# Patient Record
Sex: Male | Born: 1990 | Race: Black or African American | Hispanic: No | Marital: Single | Smoking: Light tobacco smoker
Health system: Southern US, Community
[De-identification: ages and names within clinical notes are randomized; demographics above are authoritative.]

## PROBLEM LIST (undated history)

## (undated) DIAGNOSIS — T7840XA Allergy, unspecified, initial encounter: Secondary | ICD-10-CM

## (undated) HISTORY — DX: Allergy, unspecified, initial encounter: T78.40XA

---
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
## 2013-07-15 ENCOUNTER — Ambulatory Visit (INDEPENDENT_AMBULATORY_CARE_PROVIDER_SITE_OTHER): Payer: Federal, State, Local not specified - PPO | Admitting: Emergency Medicine

## 2013-07-15 VITALS — BP 124/72 | HR 64 | Temp 97.9°F | Resp 18 | Ht 71.5 in | Wt 145.8 lb

## 2013-07-15 DIAGNOSIS — Z2089 Contact with and (suspected) exposure to other communicable diseases: Secondary | ICD-10-CM

## 2013-07-15 DIAGNOSIS — Z202 Contact with and (suspected) exposure to infections with a predominantly sexual mode of transmission: Secondary | ICD-10-CM

## 2013-07-15 DIAGNOSIS — R809 Proteinuria, unspecified: Secondary | ICD-10-CM

## 2013-07-15 LAB — POCT URINALYSIS DIPSTICK
Bilirubin, UA: NEGATIVE
Glucose, UA: NEGATIVE
Spec Grav, UA: >=1.03

## 2013-07-15 NOTE — Addendum Note (Signed)
Addended by: Max Fickle on: 07/15/2013 08:14 PM   Modules accepted: Orders

## 2013-07-15 NOTE — Patient Instructions (Addendum)
Return to clinic in about 4-6 weeks to check on your your for protein.   Proteinuria Proteinuria is a condition in which urine contains more protein than is normal. Proteinuria is either a sign that your body is producing too much protein or a sign that there is a problem with the kidneys. Healthy kidneys prevent most substances that the body needs, including proteins, from leaving the bloodstream and ending up in urine. CAUSES  Proteinuria may be caused by a temporary event or condition such as stress, exercise, or fever, and go away on its own. Proteinuria may also be a symptom of a more serious condition or disease. Causes of proteinuria include:  A kidney disease caused by:  Diabetes.  High blood pressure (hypertension).   A disease that affects the immune system, such as lupus.  A genetic disease, such as Alport's syndrome.  Medicines that damage the kidneys, such as long-term nonsteroidal anti-inflammatory drugs (NSAIDs).  Poisoning or exposure to toxic substances.  A reoccurring kidney or urinary infection.  Excess protein production in the body caused by:  Multiple myeloma.  Amyloidosis. SYMPTOMS You may have proteinuria without having noticeable symptoms. If there is a large amount of protein in your urine, your urine may look foamy. You may also notice swelling (edema) in your hands, feet, abdomen, or face. DIAGNOSIS To determine whether you have proteinuria, you will need to provide a urine sample. Your urine will then be tested for too much protein and the main blood protein albumin. If your test shows that you have proteinuria, you may need to take additional tests to determine its cause, how much protein is in your urine, and what type of protein is being lost. Tests may include:  Blood tests.  Urine tests.  A blood pressure measurement.  Imaging tests. TREATMENT  Treatment will depend on the cause of your proteinuria. Your caregiver will discuss treatment  options with you after you have been diagnosed. If your proteinuria is mild or temporary, no treatment may be necessary. HOME CARE INSTRUCTIONS Ask your caregiver if monitoring the level of protein in your urine at home using simple testing strips is appropriate for you. Early detection of proteinuria can lead to early and often successful treatment of the condition causing it. Document Released: 11/27/2005 Document Revised: 07/01/2012 Document Reviewed: 03/06/2012 [facility] Patient Information 2014 [place], [place].

## 2013-07-15 NOTE — Addendum Note (Signed)
Addended by: Max Fickle on: 07/15/2013 07:52 PM   Modules accepted: Orders

## 2013-07-15 NOTE — Progress Notes (Signed)
Results for orders placed in visit on 07/15/13  POCT URINALYSIS DIPSTICK      Result Value Range   Color, UA yellow     Clarity, UA clear     Glucose, UA neg     Bilirubin, UA neg     Ketones, UA trace     Spec Grav, UA >=1.030     Blood, UA trace     pH, UA 6.0     Protein, UA 2+     Urobilinogen, UA 1.0     Nitrite, UA neg     Leukocytes, UA Negative

## 2013-07-15 NOTE — Progress Notes (Signed)
[facility] [address], [place] [address] [phone]  Date:  07/15/2013   Name:  Frank Oconnell   DOB:  03-18-1991   MRN:  [id number]  PCP:  No primary provider on file.    Chief Complaint: Annual Exam and STD testing   History of Present Illness:  Frank Oconnell is a 22 y.o. very pleasant male patient who presents with the following:  Had intercourse protected by a condom with a new partner.  Later he developed lesions on the base of his penis.  Performed oral sex and he had a "cold sore" on his upper lip following.  No history of STD in past.  No discharge or eruption.  There are no active problems to display for this patient.   Past Medical History  Diagnosis Date  . Allergy     History reviewed. No pertinent past surgical history.  History  Substance Use Topics  . Smoking status: Never Smoker   . Smokeless tobacco: Not on file  . Alcohol Use: No    Family History  Problem Relation Age of Onset  . Hypertension Mother   . Kidney disease Maternal Grandmother     No Known Allergies  Medication list has been reviewed and updated.  No current outpatient prescriptions on file prior to visit.   No current facility-administered medications on file prior to visit.    Review of Systems:  As per HPI, otherwise negative.    Physical Examination: Filed Vitals:   07/15/13 1900  BP: 124/72  Pulse: 64  Temp: 97.9 F (36.6 C)  Resp: 18   Filed Vitals:   07/15/13 1900  Height: 5' 11.5" (1.816 m)  Weight: 145 lb 12.8 oz (66.134 kg)   Body mass index is 20.05 kg/(m^2). Ideal Body Weight: Weight in (lb) to have BMI = 25: 181.4   GEN: WDWN, NAD, Non-toxic, Alert & Oriented x 3 HEENT: Atraumatic, Normocephalic. Herpetic appearing lesion on upper lip Ears and Nose: No external deformity. EXTR: No clubbing/cyanosis/edema NEURO: Normal gait.  PSYCH: Normally interactive. Conversant. Not depressed or anxious appearing.  Calm  demeanor.  GENITALIA:  Normal male circumcised.  Testes, cord, epididymis and phalus normal  Assessment and Plan: STD exposure Labs   Signed,  Phillips Odor, MD

## 2013-07-16 ENCOUNTER — Emergency Department (HOSPITAL_COMMUNITY)
Admission: EM | Admit: 2013-07-16 | Discharge: 2013-07-17 | Disposition: A | Discharge status: Home / Self Care | Payer: Federal, State, Local not specified - PPO | Attending: Emergency Medicine | Admitting: Emergency Medicine

## 2013-07-16 ENCOUNTER — Encounter (HOSPITAL_COMMUNITY): Payer: Self-pay | Admitting: Emergency Medicine

## 2013-07-16 ENCOUNTER — Emergency Department (HOSPITAL_COMMUNITY): Payer: Federal, State, Local not specified - PPO

## 2013-07-16 DIAGNOSIS — I861 Scrotal varices: Secondary | ICD-10-CM | POA: Insufficient documentation

## 2013-07-16 DIAGNOSIS — N509 Disorder of male genital organs, unspecified: Secondary | ICD-10-CM | POA: Insufficient documentation

## 2013-07-16 DIAGNOSIS — N50812 Left testicular pain: Secondary | ICD-10-CM

## 2013-07-16 LAB — COMPREHENSIVE METABOLIC PANEL
ALT: 14 U/L (ref 0–53)
Albumin: 4.6 g/dL (ref 3.5–5.2)
Alkaline Phosphatase: 49 U/L (ref 39–117)
BUN: 9 mg/dL (ref 6–23)
CO2: 29 mEq/L (ref 19–32)
Chloride: 100 mEq/L (ref 96–112)
Glucose, Bld: 167 mg/dL — ABNORMAL HIGH (ref 70–99)
Potassium: 4.1 mEq/L (ref 3.5–5.3)
Sodium: 135 mEq/L (ref 135–145)
Total Protein: 7.4 g/dL (ref 6.0–8.3)

## 2013-07-16 LAB — URINALYSIS, ROUTINE W REFLEX MICROSCOPIC
Bilirubin Urine: NEGATIVE
Hgb urine dipstick: NEGATIVE
Leukocytes, UA: NEGATIVE
Protein, ur: NEGATIVE mg/dL
Specific Gravity, Urine: 1.016 (ref 1.005–1.030)
Urobilinogen, UA: 1 mg/dL (ref 0.0–1.0)
pH: 6 (ref 5.0–8.0)

## 2013-07-16 NOTE — ED Notes (Addendum)
Pt c/o L testicle pain x 2 days, denies injury. Pt states recently drove from [place], noticed pain after trip, pt also states he is having difficulty getting erection.

## 2013-07-16 NOTE — ED Provider Notes (Signed)
CSN: [id number]     Arrival date & time 07/16/13  2151 History   First MD Initiated Contact with Patient 07/16/13 2304     Chief Complaint  Patient presents with  . Testicle Pain   (Consider location/radiation/quality/duration/timing/severity/associated sxs/prior Treatment) HPI 22 year old male presents to emergency room with complaint of left testicle pain on and off for the last 2 days.  He's been taking ibuprofen without improvement.  Patient noted pain while driving down from [place].  He has not noticed any swelling to the testicle, but notes that the underside of his penis looks a little different.  Pain will last for an hour or 2, and then resolve.  No urinary symptoms.  Past Medical History  Diagnosis Date  . Allergy    History reviewed. No pertinent past surgical history. Family History  Problem Relation Age of Onset  . Hypertension Mother   . Kidney disease Maternal Grandmother    History  Substance Use Topics  . Smoking status: Never Smoker   . Smokeless tobacco: Not on file  . Alcohol Use: Yes     Comment: occ    Review of Systems  All other systems reviewed and are negative.    Allergies  Review of patient's allergies indicates no known allergies.  Home Medications   Current Outpatient Rx  Name  Route  Sig  Dispense  Refill  . ibuprofen (ADVIL,MOTRIN) 200 MG tablet   Oral   Take 400 mg by mouth every 8 (eight) hours as needed for pain.          BP 143/73  Pulse 92  Temp(Src) 99.1 F (37.3 C) (Oral)  Resp 18  Ht 5\' 11"  (1.803 m)  Wt 145 lb (65.772 kg)  BMI 20.23 kg/m2  SpO2 100% Physical Exam  Nursing note and vitals reviewed. Constitutional: He is oriented to person, place, and time. He appears well-developed and well-nourished.  HENT:  Head: Normocephalic and atraumatic.  Eyes: Conjunctivae and EOM are normal. Pupils are equal, round, and reactive to light.  Neck: Normal range of motion. Neck supple. No JVD present. No tracheal deviation  present. No thyromegaly present.  Cardiovascular: Normal rate, regular rhythm, normal heart sounds and intact distal pulses.  Exam reveals no gallop and no friction rub.   No murmur heard. Pulmonary/Chest: Effort normal and breath sounds normal. No stridor. No respiratory distress. He has no wheezes. He has no rales. He exhibits no tenderness.  Abdominal: Soft. Bowel sounds are normal. He exhibits no distension and no mass. There is no tenderness. There is no rebound and no guarding.  Genitourinary: Penis normal. No penile tenderness.  Mild tenderness to left testicle.  No swelling, abnormal lie, masses noted  Musculoskeletal: Normal range of motion. He exhibits no edema and no tenderness.  Lymphadenopathy:    He has no cervical adenopathy.  Neurological: He is alert and oriented to person, place, and time. He exhibits normal muscle tone. Coordination normal.  Skin: Skin is warm and dry. No rash noted. No erythema. No pallor.  Psychiatric: He has a normal mood and affect. His behavior is normal. Judgment and thought content normal.    ED Course  Procedures (including critical care time) Labs Review Labs Reviewed  POCT I-STAT, CHEM 8 - Abnormal; Notable for the following:    Creatinine, Ser 1.50 (*)    Calcium, Ion 1.27 (*)    All other components within normal limits  URINALYSIS, ROUTINE W REFLEX MICROSCOPIC   Imaging Review US Scrotum  07/17/2013   *RADIOLOGY REPORT*  Clinical Data:  22 year old male with left testicular pain.  SCROTAL ULTRASOUND DOPPLER ULTRASOUND OF THE TESTICLES  Technique: Complete ultrasound examination of the testicles, epididymis, and other scrotal structures was performed.  Color and spectral Doppler ultrasound were also utilized to evaluate blood flow to the testicles.  Comparison:  None  Findings:  Right testis:  The right testicle is normal in echogenicity and size.  Normal color Doppler flow and arterial/venous waveforms are noted.  Multiple  microcalcifications within the testicle noted. There is no evidence of focal mass.  Left testis:  The left testicle is normal in echogenicity and size. Normal color Doppler flow and arterial/venous waveforms are noted. Multiple microcalcifications within the testicle noted.  There is no evidence of focal mass.  Right epididymis:  Normal in size and appearance.  Left epididymis:  Normal in size and appearance.  Hydrocele:  Absent  Varicocele:  There is a right varicocele with extremely slow flow. There is no evidence of left varicocele.  Pulsed Doppler interrogation of both testes demonstrates low resistance flow bilaterally.  IMPRESSION: No evidence of testicular torsion.  Testicular microlithiasis.  Current literature suggests that testicular microlithiasis is not a significant independent risk factor for development of testicular carcinoma, and that followup imaging is not warranted in the absence of other risk factors. Monthly testicular self-examination and annual physical exams are considered appropriate surveillance.  If patient has other risk factors for testicular carcinoma, then referral to Urology should be considered.  (Reference:  DeCastro, et al.:  A 5-Year Followup Study of Asymptomatic Men with Testicular Microlithiasis.  J Urol 2008; 179:1420-1423.)  Right varicocele with extremely slow flow. Given the very uncommon incidence of isolated right varicocele, further imaging of the abdomen/pelvis with CT/MR may be warranted to exclude a mass lesion or venous anomaly.   Original Report Authenticated By: Harmon Pier, M.D.   Ct Abdomen Pelvis W Contrast  07/17/2013   CLINICAL DATA:  Left-sided testicular pain for 2 days. Right-sided varicocele. Rule out obstructing mass.  EXAM: CT ABDOMEN AND PELVIS WITH CONTRAST  TECHNIQUE: Multidetector CT imaging of the abdomen and pelvis was performed using the standard protocol following bolus administration of intravenous contrast.  CONTRAST:  50mL OMNIPAQUE IOHEXOL  300 MG/ML SOLN, OMNIPAQUE IOHEXOL 300 MG/ML SOLN  COMPARISON:  Ultrasound 1 day prior  FINDINGS: Lung bases: Clear lung bases. Normal heart size without pericardial or pleural effusion.  Abdomen/Pelvis: Normal liver, spleen, stomach, pancreas, gallbladder, biliary tract, adrenal glands, kidneys. No evidence of right renal or perirenal mass. No retroperitoneal or retrocrural adenopathy.  Normal large and small bowel, without ascites.  No pelvic adenopathy. Normal urinary bladder and prostate. No significant free fluid.  Bones/Musculoskeletal: No acute osseous abnormality.  IMPRESSION: Normal abdominal/pelvic CT. No evidence of right-sided renal mass or other explanation for right-sided varicocele.   Electronically Signed   By: Jeronimo Greaves   On: 07/17/2013 04:19   [place] Art/ven Flow Abd Pelv Doppler  07/17/2013   *RADIOLOGY REPORT*  Clinical Data:  22 year old male with left testicular pain.  SCROTAL ULTRASOUND DOPPLER ULTRASOUND OF THE TESTICLES  Technique: Complete ultrasound examination of the testicles, epididymis, and other scrotal structures was performed.  Color and spectral Doppler ultrasound were also utilized to evaluate blood flow to the testicles.  Comparison:  None  Findings:  Right testis:  The right testicle is normal in echogenicity and size.  Normal color Doppler flow and arterial/venous waveforms are noted.  Multiple microcalcifications within the testicle  noted. There is no evidence of focal mass.  Left testis:  The left testicle is normal in echogenicity and size. Normal color Doppler flow and arterial/venous waveforms are noted. Multiple microcalcifications within the testicle noted.  There is no evidence of focal mass.  Right epididymis:  Normal in size and appearance.  Left epididymis:  Normal in size and appearance.  Hydrocele:  Absent  Varicocele:  There is a right varicocele with extremely slow flow. There is no evidence of left varicocele.  Pulsed Doppler interrogation of both testes  demonstrates low resistance flow bilaterally.  IMPRESSION: No evidence of testicular torsion.  Testicular microlithiasis.  Current literature suggests that testicular microlithiasis is not a significant independent risk factor for development of testicular carcinoma, and that followup imaging is not warranted in the absence of other risk factors. Monthly testicular self-examination and annual physical exams are considered appropriate surveillance.  If patient has other risk factors for testicular carcinoma, then referral to Urology should be considered.  (Reference:  DeCastro, et al.:  A 5-Year Followup Study of Asymptomatic Men with Testicular Microlithiasis.  J Urol 2008; 179:1420-1423.)  Right varicocele with extremely slow flow. Given the very uncommon incidence of isolated right varicocele, further imaging of the abdomen/pelvis with CT/MR may be warranted to exclude a mass lesion or venous anomaly.   Original Report Authenticated By: Harmon Pier, M.D.    MDM   1. Pain in left testicle   2. Right varicocele    22 year old male with left testicular pain.  Will check ultrasound, UA.   U/s with concern for right sided varicocele, recommend CT scan.  CT scan normal.  Will f/u with urology prn.   Olivia Mackie, MD 07/17/13 [phone]

## 2013-07-17 ENCOUNTER — Emergency Department (HOSPITAL_COMMUNITY): Payer: Federal, State, Local not specified - PPO

## 2013-07-17 LAB — POCT I-STAT, CHEM 8
Chloride: 101 mEq/L (ref 96–112)
Creatinine, Ser: 1.5 mg/dL — ABNORMAL HIGH (ref 0.50–1.35)
Glucose, Bld: 90 mg/dL (ref 70–99)
HCT: 49 % (ref 39.0–52.0)
Hemoglobin: 16.7 g/dL (ref 13.0–17.0)
Potassium: 4 mEq/L (ref 3.5–5.1)
Sodium: 140 mEq/L (ref 135–145)

## 2013-07-17 LAB — GC/CHLAMYDIA PROBE AMP: CT Probe RNA: NEGATIVE

## 2013-07-17 MED ORDER — IOHEXOL 300 MG/ML  SOLN
50.0000 mL | Freq: Once | INTRAMUSCULAR | Status: AC | PRN
  Administered 2013-07-17: 50 mL via ORAL

## 2013-07-17 MED ORDER — TRAMADOL HCL 50 MG PO TABS
50.0000 mg | ORAL_TABLET | Freq: Four times a day (QID) | ORAL | Status: DC | PRN
  Administered 2013-07-17: 50 mg via ORAL
  Filled 2013-07-17: qty 1

## 2013-07-17 MED ORDER — IOHEXOL 300 MG/ML  SOLN
100.0000 mL | Freq: Once | INTRAMUSCULAR | Status: AC | PRN
  Administered 2013-07-17: 100 mL via INTRAVENOUS

## 2013-07-17 MED ORDER — NAPROXEN 500 MG PO TABS: 500.0000 mg | ORAL_TABLET | Freq: Two times a day (BID) | ORAL | Status: DC

## 2013-07-17 NOTE — ED Notes (Signed)
Bed: WR60 Expected date:  Expected time:  Means of arrival:  Comments: RM 5

## 2013-07-18 ENCOUNTER — Telehealth: Payer: Self-pay

## 2013-07-18 NOTE — Telephone Encounter (Signed)
Pt looking for lab results  Best number [phone]

## 2013-07-18 NOTE — Telephone Encounter (Signed)
Patient advised G/C negative, others pending we will call when they result.

## 2013-07-20 ENCOUNTER — Other Ambulatory Visit: Payer: Self-pay | Admitting: *Deleted

## 2013-07-20 ENCOUNTER — Other Ambulatory Visit (INDEPENDENT_AMBULATORY_CARE_PROVIDER_SITE_OTHER): Payer: Federal, State, Local not specified - PPO | Admitting: Family Medicine

## 2013-07-20 DIAGNOSIS — R739 Hyperglycemia, unspecified: Secondary | ICD-10-CM

## 2013-07-20 DIAGNOSIS — R7309 Other abnormal glucose: Secondary | ICD-10-CM

## 2013-07-20 LAB — POCT GLYCOSYLATED HEMOGLOBIN (HGB A1C): Hemoglobin A1C: 5.4

## 2013-07-21 ENCOUNTER — Telehealth: Payer: Self-pay

## 2013-07-21 NOTE — Telephone Encounter (Signed)
Pt is calling back because he missed a call from our office he believes it could have been about labs Call back number is [phone]

## 2013-07-21 NOTE — Telephone Encounter (Signed)
Was a lab call, his labs normal

## 2014-12-04 ENCOUNTER — Ambulatory Visit (INDEPENDENT_AMBULATORY_CARE_PROVIDER_SITE_OTHER): Payer: Federal, State, Local not specified - PPO | Admitting: Family Medicine

## 2014-12-04 VITALS — BP 112/70 | HR 64 | Temp 97.9°F | Resp 16 | Ht 72.0 in | Wt 149.8 lb

## 2014-12-04 DIAGNOSIS — J209 Acute bronchitis, unspecified: Secondary | ICD-10-CM | POA: Diagnosis not present

## 2014-12-04 MED ORDER — BENZONATATE 200 MG PO CAPS: 200.0000 mg | ORAL_CAPSULE | Freq: Three times a day (TID) | ORAL | Status: DC | PRN

## 2014-12-04 MED ORDER — HYDROCODONE-HOMATROPINE 5-1.5 MG/5ML PO SYRP: 5.0000 mL | ORAL_SOLUTION | Freq: Three times a day (TID) | ORAL | Status: DC | PRN

## 2014-12-04 NOTE — Patient Instructions (Signed)

## 2014-12-04 NOTE — Progress Notes (Signed)
Subjective:  This chart was scribed for Dr. Norberto Sorenson, MD by Jarvis Morgan, ED Scribe. This patient was seen in Room 11 and the patient's care was started at 2:41 PM.   Patient ID: Frank Oconnell, male    DOB: 1991-05-31, 25 y.o.   MRN: [id number]  Chief Complaint  Patient presents with  . Cough    5 days   . Chest Pain  . Nasal Congestion  . Sore Throat    3 days    HPI HPI Comments: Frank Oconnell is a 24 y.o. male who presents to the [facility] complaining of an intermittent, productive, cough for 5 days. He is having associated sore throat, nasal congestion, and chest tightness. He has not taken any OTC medications. Pt states that the symptoms get worse at night. He denies any allergies. He denies any tobacco use. He denies any sinus pressure, fever, SOB, ear pain, or wheezing.    Past Medical History  Diagnosis Date  . Allergy    Current Outpatient Prescriptions on File Prior to Visit  Medication Sig Dispense Refill  . ibuprofen (ADVIL,MOTRIN) 200 MG tablet Take 400 mg by mouth every 8 (eight) hours as needed for pain.    . naproxen (NAPROSYN) 500 MG tablet Take 1 tablet (500 mg total) by mouth 2 (two) times daily. (Patient not taking: Reported on 12/04/2014) 30 tablet 0   No current facility-administered medications on file prior to visit.   No Known Allergies   Review of Systems  Constitutional: Negative for fever.  HENT: Positive for congestion and sore throat. Negative for ear pain and sinus pressure.   Respiratory: Positive for cough and chest tightness. Negative for shortness of breath and wheezing.     Objective:   Physical Exam  Constitutional: He is oriented to person, place, and time. He appears well-developed and well-nourished. No distress.  HENT:  Head: Normocephalic and atraumatic.  Right Ear: Tympanic membrane, external ear and ear canal normal.  Left Ear: Tympanic membrane, external ear and ear canal normal.  Nose: Mucosal  edema present.  Mouth/Throat: Uvula is midline. Posterior oropharyngeal edema and posterior oropharyngeal erythema present. No oropharyngeal exudate.  Eyes: Conjunctivae and EOM are normal.  Neck: Neck supple. No tracheal deviation present.  Cardiovascular: Normal rate, regular rhythm, S1 normal, S2 normal and normal heart sounds.   No murmur heard. Pulmonary/Chest: Effort normal and breath sounds normal. No respiratory distress. He has no decreased breath sounds.  Musculoskeletal: Normal range of motion.  Lymphadenopathy:    He has cervical adenopathy.       Right cervical: Posterior cervical adenopathy present.       Left cervical: Posterior cervical adenopathy present.  Neurological: He is alert and oriented to person, place, and time.  Skin: Skin is warm and dry.  Psychiatric: He has a normal mood and affect. His behavior is normal.  Nursing note and vitals reviewed.   Filed Vitals:   12/04/14 1359  BP: 112/70  Pulse: 64  Temp: 97.9 F (36.6 C)  TempSrc: Oral  Resp: 16  Height: 6' (1.829 m)  Weight: 149 lb 12.8 oz (67.949 kg)  SpO2: 99%       Assessment & Plan:   Acute bronchitis, unspecified organism  Meds ordered this encounter  Medications  . HYDROcodone-homatropine (HYCODAN) 5-1.5 MG/5ML syrup    Sig: Take 5 mLs by mouth every 8 (eight) hours as needed for cough.    Dispense:  120 mL  Refill:  0  . benzonatate (TESSALON) 200 MG capsule    Sig: Take 1 capsule (200 mg total) by mouth 3 (three) times daily as needed for cough.    Dispense:  30 capsule    Refill:  0    I personally performed the services described in this documentation, which was scribed in my presence. The recorded information has been reviewed and considered, and addended by me as needed.  Norberto SorensonEva Kandra Graven, MD MPH

## 2015-01-20 ENCOUNTER — Ambulatory Visit (INDEPENDENT_AMBULATORY_CARE_PROVIDER_SITE_OTHER): Payer: Federal, State, Local not specified - PPO | Admitting: Physician Assistant

## 2015-01-20 VITALS — BP 110/68 | HR 62 | Temp 98.2°F | Resp 16 | Ht 72.0 in | Wt 152.4 lb

## 2015-01-20 DIAGNOSIS — Z113 Encounter for screening for infections with a predominantly sexual mode of transmission: Secondary | ICD-10-CM | POA: Diagnosis not present

## 2015-01-20 DIAGNOSIS — Z202 Contact with and (suspected) exposure to infections with a predominantly sexual mode of transmission: Secondary | ICD-10-CM

## 2015-01-20 DIAGNOSIS — J358 Other chronic diseases of tonsils and adenoids: Secondary | ICD-10-CM

## 2015-01-20 MED ORDER — AZITHROMYCIN 500 MG PO TABS: ORAL_TABLET | ORAL | Status: DC

## 2015-01-20 NOTE — Progress Notes (Signed)
   Subjective:    Patient ID: Frank Oconnell, male    DOB: 1991-06-24, 24 y.o.   MRN: [id number]  HPI Patient presents for spot in back of throat and STD check.   Noticed white patch of tonsil 2 days ago. Is not painful or erythematous. Denies fever, sore throat, recent URI, difficulty swallowing, or bad breath. Has gargled with warm water and that has not made a difference. Appearance is just annoying.   Has had 4 partner this year and more in 2015. Uses condoms if one is around, but has unprotected sex often. Was told by one partner 2 days ago that she is positive for chlamydia. Denies penile discharge, swelling of penis/scrotom, dysuria, frequency, abdominal/pelvic/back pain, lesions, or hematuria.    Review of Systems  Constitutional: Negative.   HENT: Negative.   Gastrointestinal: Negative.   Genitourinary: Negative.   Skin: Negative.        Objective:   Physical Exam  Constitutional: He is oriented to person, place, and time. He appears well-developed and well-nourished. No distress.  Blood pressure 110/68, pulse 62, temperature 98.2 F (36.8 C), temperature source Oral, resp. rate 16, height 6' (1.829 m), weight 152 lb 6.4 oz (69.128 kg), SpO2 99 %.  HENT:  Head: Normocephalic and atraumatic.  Right Ear: External ear normal.  Left Ear: External ear normal.  Eyes: Pupils are equal, round, and reactive to light. Right eye exhibits no discharge. Left eye exhibits no discharge.  Neck: Normal range of motion. Neck supple. No thyromegaly present.  Cardiovascular: Normal rate, regular rhythm and normal heart sounds.  Exam reveals no gallop and no friction rub.   No murmur heard. Pulmonary/Chest: Effort normal and breath sounds normal. No respiratory distress. He has no wheezes. He has no rales.  Abdominal: Soft. Bowel sounds are normal. He exhibits no distension. There is no tenderness.  Lymphadenopathy:    He has no cervical adenopathy.  Neurological: He is alert and oriented  to person, place, and time.  Skin: Skin is warm and dry. No rash noted. He is not diaphoretic. No erythema. No pallor.       Assessment & Plan:  1. Chlamydia contact, untreated Discussed and gave reading on safe sex practices.  - azithromycin (ZITHROMAX) 500 MG tablet; Take 1 g (2 tablets) po with food.  Dispense: 2 tablet; Refill: 0  2. Screen for STD (sexually transmitted disease) - GC/Chlamydia Probe Amp - RPR - HIV antibody (with reflex)  3. Tonsillolith No f/u necessary. If becomes uncomfortable or develops halitosis, will refer to ENT.   Janan Ridgeishira Khaleb Broz PA-C  [facility] 01/20/2015 3:29 PM

## 2015-01-20 NOTE — Patient Instructions (Signed)

## 2015-01-21 ENCOUNTER — Telehealth: Payer: Self-pay | Admitting: Physician Assistant

## 2015-01-21 LAB — RPR

## 2015-01-21 LAB — HIV ANTIBODY (ROUTINE TESTING W REFLEX): HIV: NONREACTIVE

## 2015-01-21 LAB — GC/CHLAMYDIA PROBE AMP
CT Probe RNA: NEGATIVE
GC Probe RNA: NEGATIVE

## 2015-01-21 NOTE — Telephone Encounter (Signed)
Left vmail that all labs are negative and this is a good opportunity to make safe sex a lifestyle choice and not sporadic.

## 2015-03-31 IMAGING — US US SCROTUM
1 series · 13 of 25 positions shown · non-contrast
Comparison: None

CLINICAL DATA: 22-year-old male with left testicular pain.

SCROTAL ULTRASOUND
DOPPLER ULTRASOUND OF THE TESTICLES
TECHNIQUE: Complete ultrasound examination of the testicles,
epididymis, and other scrotal structures was performed.  Color and
spectral Doppler ultrasound were also utilized to evaluate blood
flow to the testicles.

[Series 1: us scrotum · 0.07mm/px · 38 acquisitions, 13 frames shown]
[im 1/38]
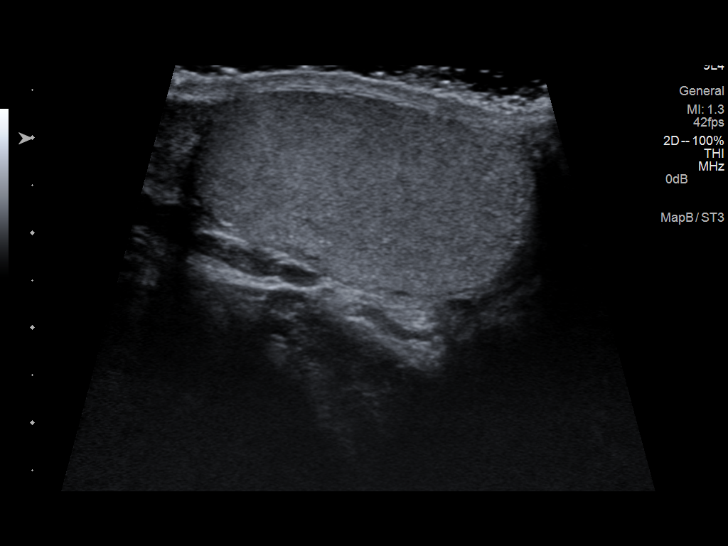
[im 4/38]
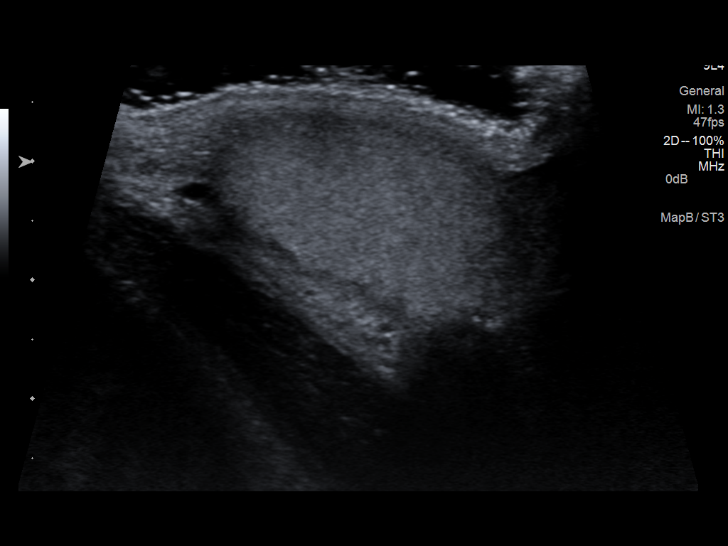
[im 7/38]
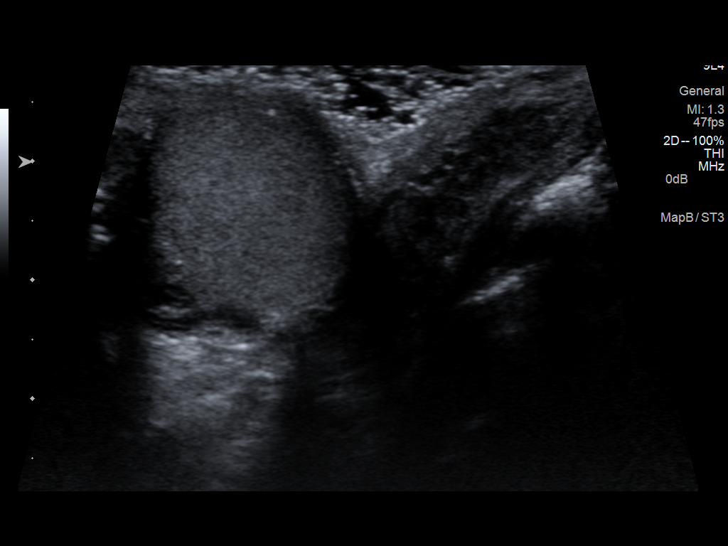
[im 10/38]
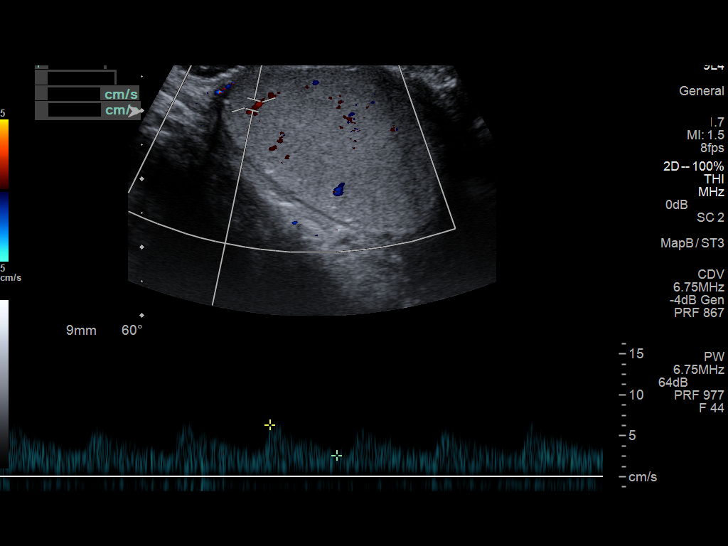
[im 13/38]
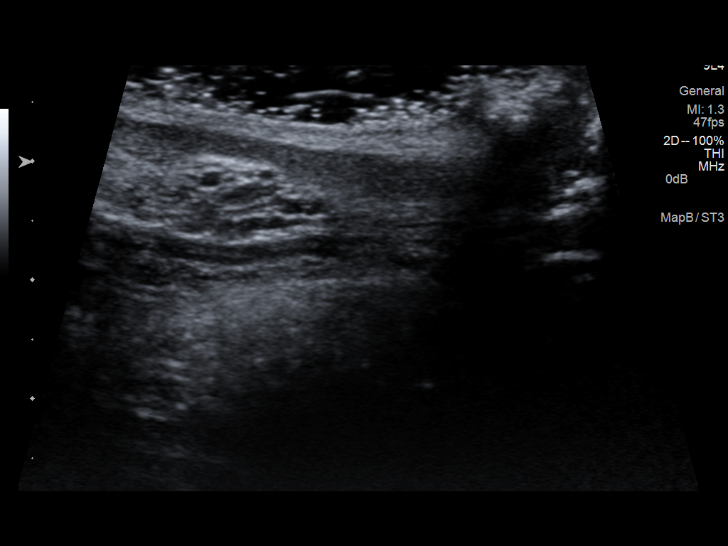
[im 16/38]
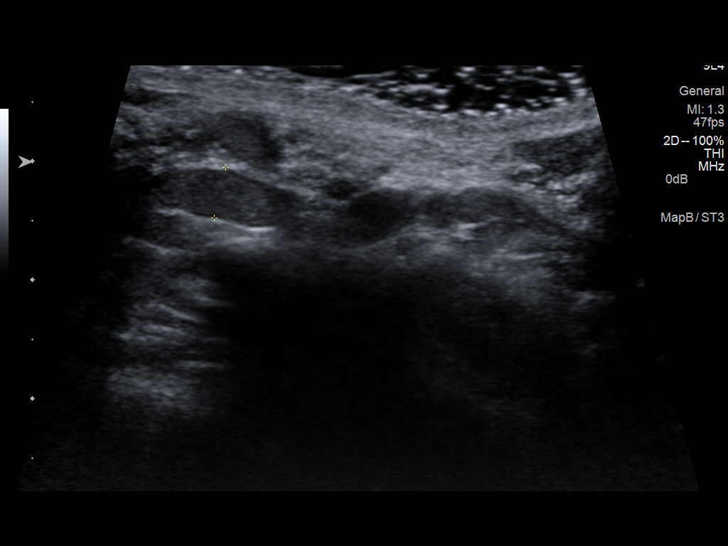
[im 19/38]
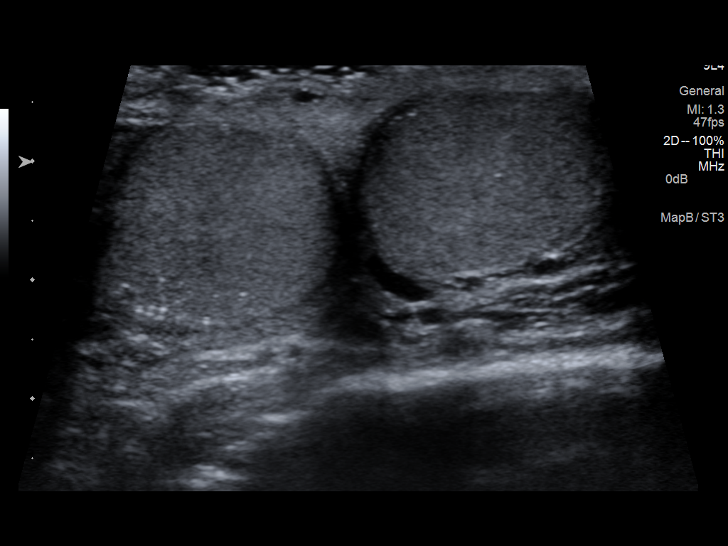
[im 22/38]
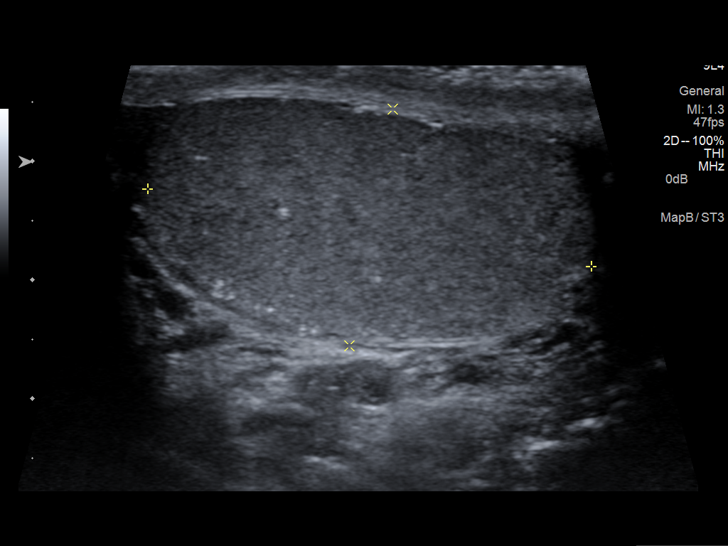
[im 25/38]
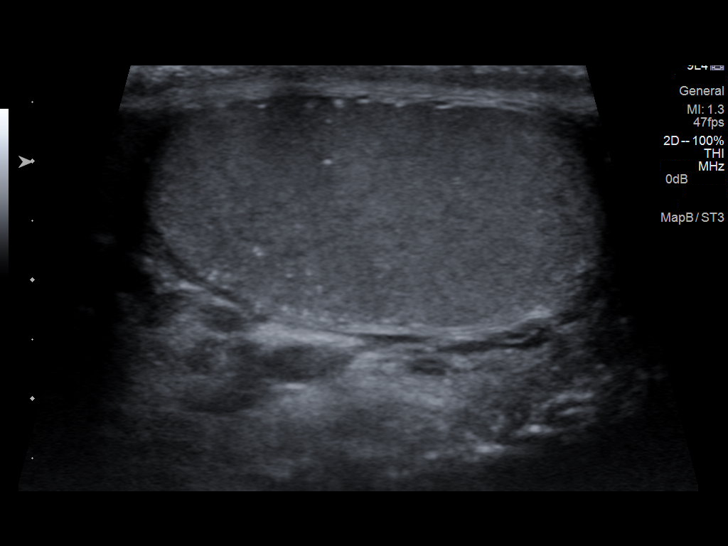
[im 28/38]
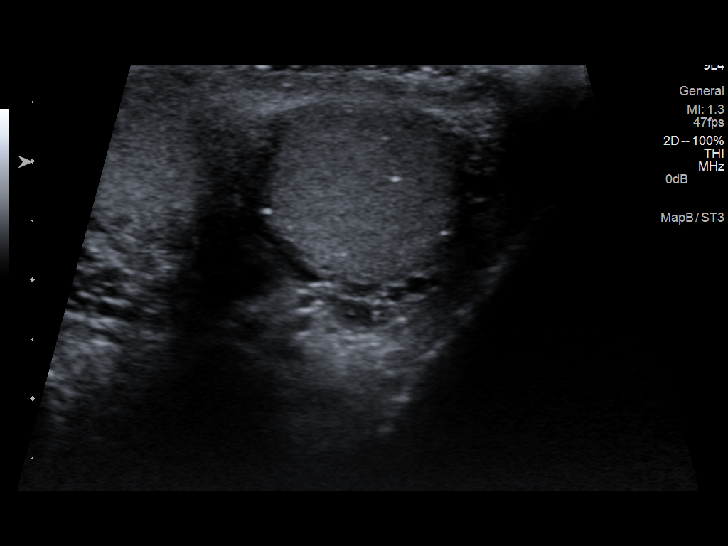
[im 31/38]
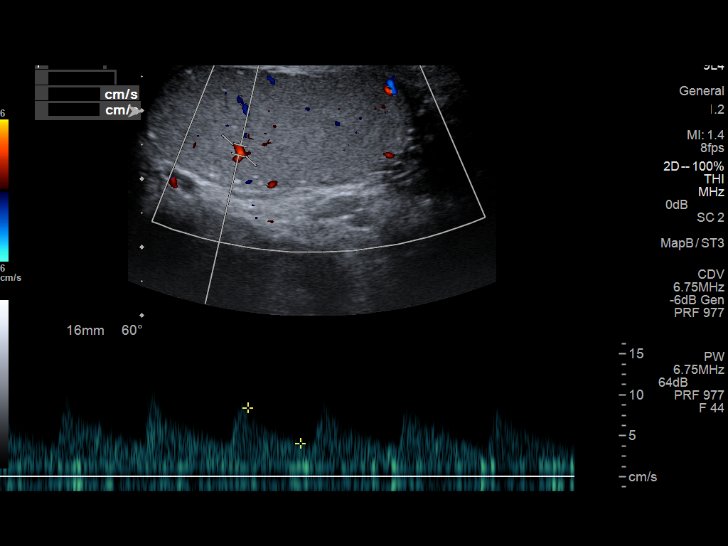
[im 34/38]
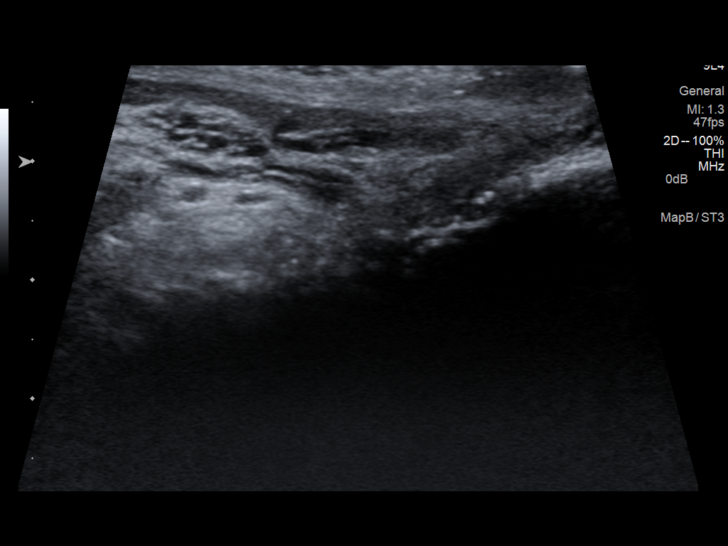
[im 38/38]
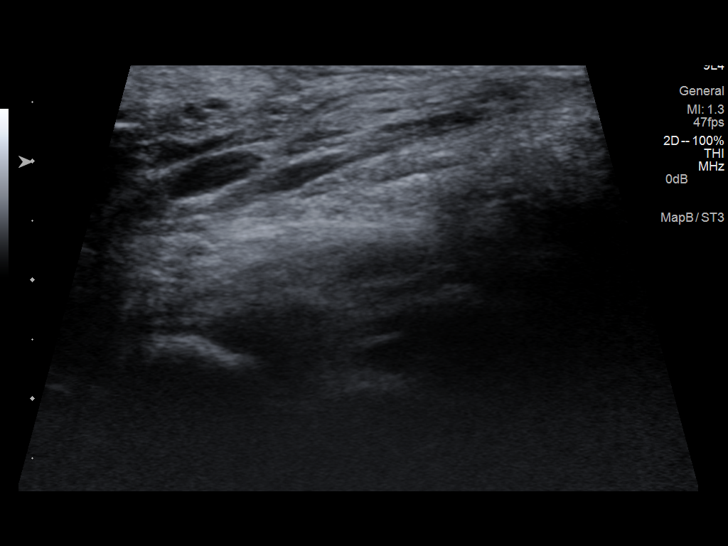

[13 of 25 positions shown; findings below may reference images not displayed]

FINDINGS: Right testis:  The right testicle is normal in echogenicity and
size.  Normal color Doppler flow and arterial/venous waveforms are
noted.  Multiple microcalcifications within the testicle noted.
There is no evidence of focal mass.

Left testis:  The left testicle is normal in echogenicity and size.
Normal color Doppler flow and arterial/venous waveforms are noted.
Multiple microcalcifications within the testicle noted.  There is
no evidence of focal mass.

Right epididymis:  Normal in size and appearance.

Left epididymis:  Normal in size and appearance.

Hydrocele:  Absent

Varicocele:  There is a right varicocele with extremely slow flow.
There is no evidence of left varicocele.

Pulsed Doppler interrogation of both testes demonstrates low
resistance flow bilaterally.
IMPRESSION: No evidence of testicular torsion.

Testicular microlithiasis.  Current literature suggests that
testicular microlithiasis is not a significant independent risk
factor for development of testicular carcinoma, and that followup
imaging is not warranted in the absence of other risk factors.
Monthly testicular self-examination and annual physical exams are
considered appropriate surveillance.  If patient has other risk
factors for testicular carcinoma, then referral to Urology should
be considered.  (Reference:  Vindel, et al.:  A 5-Year Followup
Study of Asymptomatic Men with Testicular Microlithiasis.  J Urol
5553; [id number].)

Right varicocele with extremely slow flow. Given the very uncommon
incidence of isolated right varicocele, further imaging of the
abdomen/pelvis with CT/MR may be warranted to exclude a mass lesion
or venous anomaly.

## 2016-02-06 ENCOUNTER — Ambulatory Visit (INDEPENDENT_AMBULATORY_CARE_PROVIDER_SITE_OTHER): Payer: Federal, State, Local not specified - PPO | Admitting: Physician Assistant

## 2016-02-06 VITALS — BP 134/80 | HR 68 | Temp 97.9°F | Resp 16 | Ht 72.5 in | Wt 154.0 lb

## 2016-02-06 DIAGNOSIS — Z7251 High risk heterosexual behavior: Secondary | ICD-10-CM

## 2016-02-06 DIAGNOSIS — Z113 Encounter for screening for infections with a predominantly sexual mode of transmission: Secondary | ICD-10-CM

## 2016-02-06 NOTE — Patient Instructions (Addendum)
IF you received an x-ray today, you will receive an invoice from [facility]. Please contact [facility] at [phone] with questions or concerns regarding your invoice.   IF you received labwork today, you will receive an invoice from [employer] [facility]. Please contact [facility] at [phone] with questions or concerns regarding your invoice.   Our billing staff will not be able to assist you with questions regarding bills from these companies.  You will be contacted with the lab results as soon as they are available. The fastest way to get your results is to activate your My Chart account. Instructions are located on the last page of this paperwork. If you have not heard from [place] regarding the results in 2 weeks, please contact this office.     Please return the urine as soon as possible.  I will have your lab results within 7-10 days. Sexually Transmitted Disease A sexually transmitted disease (STD) is a disease or infection that may be passed (transmitted) from person to person, usually during sexual activity. This may happen by way of saliva, semen, blood, vaginal mucus, or urine. Common STDs include:  Gonorrhea.  Chlamydia.  Syphilis.  HIV and AIDS.  Genital herpes.  Hepatitis B and C.  Trichomonas.  Human papillomavirus (HPV).  Pubic lice.  Scabies.  Mites.  Bacterial vaginosis. WHAT ARE CAUSES OF STDs? An STD may be caused by bacteria, a virus, or parasites. STDs are often transmitted during sexual activity if one person is infected. However, they may also be transmitted through nonsexual means. STDs may be transmitted after:   Sexual intercourse with an infected person.  Sharing sex toys with an infected person.  Sharing needles with an infected person or using unclean piercing or tattoo needles.  Having intimate contact with the genitals, mouth, or rectal areas of an infected person.  Exposure to infected fluids  during birth. WHAT ARE THE SIGNS AND SYMPTOMS OF STDs? Different STDs have different symptoms. Some people may not have any symptoms. If symptoms are present, they may include:  Painful or bloody urination.  Pain in the pelvis, abdomen, vagina, anus, throat, or eyes.  A skin rash, itching, or irritation.  Growths, ulcerations, blisters, or sores in the genital and anal areas.  Abnormal vaginal discharge with or without bad odor.  Penile discharge in men.  Fever.  Pain or bleeding during sexual intercourse.  Swollen glands in the groin area.  Yellow skin and eyes (jaundice). This is seen with hepatitis.  Swollen testicles.  Infertility.  Sores and blisters in the mouth. HOW ARE STDs DIAGNOSED? To make a diagnosis, your health care provider may:  Take a medical history.  Perform a physical exam.  Take a sample of any discharge to examine.  Swab the throat, cervix, opening to the penis, rectum, or vagina for testing.  Test a sample of your first morning urine.  Perform blood tests.  Perform a Pap test, if this applies.  Perform a colposcopy.  Perform a laparoscopy. HOW ARE STDs TREATED? Treatment depends on the STD. Some STDs may be treated but not cured.  Chlamydia, gonorrhea, trichomonas, and syphilis can be cured with antibiotic medicine.  Genital herpes, hepatitis, and HIV can be treated, but not cured, with prescribed medicines. The medicines lessen symptoms.  Genital warts from HPV can be treated with medicine or by freezing, burning (electrocautery), or surgery. Warts may come back.  HPV cannot be cured with medicine or surgery. However, abnormal areas may be  removed from the cervix, vagina, or vulva.  If your diagnosis is confirmed, your recent sexual partners need treatment. This is true even if they are symptom-free or have a negative culture or evaluation. They should not have sex until their health care providers say it is okay.  Your health care  provider may test you for infection again 3 months after treatment. HOW CAN I REDUCE MY RISK OF GETTING AN STD? Take these steps to reduce your risk of getting an STD:  Use latex condoms, dental dams, and water-soluble lubricants during sexual activity. Do not use petroleum jelly or oils.  Avoid having multiple sex partners.  Do not have sex with someone who has other sex partners  Do not have sex with anyone you do not know or who is at high risk for an STD.  Avoid risky sex practices that can break your skin.  Do not have sex if you have open sores on your mouth or skin.  Avoid drinking too much alcohol or taking illegal drugs. Alcohol and drugs can affect your judgment and put you in a vulnerable position.  Avoid engaging in oral and anal sex acts.  Get vaccinated for HPV and hepatitis. If you have not received these vaccines in the past, talk to your health care provider about whether one or both might be right for you.  If you are at risk of being infected with HIV, it is recommended that you take a prescription medicine daily to prevent HIV infection. This is called pre-exposure prophylaxis (PrEP). You are considered at risk if:  You are a man who has sex with other men (MSM).  You are a heterosexual man or woman and are sexually active with more than one partner.  You take drugs by injection.  You are sexually active with a partner who has HIV.  Talk with your health care provider about whether you are at high risk of being infected with HIV. If you choose to begin PrEP, you should first be tested for HIV. You should then be tested every 3 months for as long as you are taking PrEP. WHAT SHOULD I DO IF I THINK I HAVE AN STD?  See your health care provider.  Tell your sexual partner(s). They should be tested and treated for any STDs.  Do not have sex until your health care provider says it is okay. WHEN SHOULD I GET IMMEDIATE MEDICAL CARE? Contact your health care  provider right away if:   You have severe abdominal pain.  You are a man and notice swelling or pain in your testicles.  You are a woman and notice swelling or pain in your vagina.   This information is not intended to replace advice given to you by your health care provider. Make sure you discuss any questions you have with your health care provider.   Document Released: 12/28/2002 Document Revised: 10/28/2014 Document Reviewed: 04/27/2013 Elsevier Interactive Patient Education [employer]2016 Elsevier Inc.

## 2016-02-07 LAB — HIV ANTIBODY (ROUTINE TESTING W REFLEX): HIV 1&2 Ab, 4th Generation: NONREACTIVE

## 2016-02-07 LAB — RPR

## 2016-02-23 ENCOUNTER — Telehealth: Payer: Self-pay

## 2016-02-23 NOTE — Telephone Encounter (Signed)
Pt. Left vm on lab phone saying he had a question about his labs, tried to call him but was unable to reach him

## 2016-03-10 NOTE — Progress Notes (Signed)
[facility] [address], [place] [id number] [phone]  Date:  02/06/2016   Name:  Frank SchlatterMichael D Todt   DOB:  27-May-1991   MRN:  [id number]  PCP:  No PCP Per Patient   Chief Complaint  Patient presents with  . STD Testing  History of Present Illness:  Frank Oconnell is a 25 y.o. male patient who [facility].    Patient is requesting std testing at this time.  He is engaging in sexual intercourse unprotected.  Various partners.   No hematuria, dysuria, or frequency.  No penile discharge.  No abdominal pain.   There are no active problems to display for this patient.   Past Medical History  Diagnosis Date  . Allergy     History reviewed. No pertinent past surgical history.  Social History  Substance Use Topics  . Smoking status: Light Tobacco Smoker  . Smokeless tobacco: None  . Alcohol Use: 0.0 oz/week    0 Standard drinks or equivalent per week     Comment: occ    Family History  Problem Relation Age of Onset  . Hypertension Mother   . Kidney disease Maternal Grandmother     No Known Allergies  Medication list has been reviewed and updated.  Current Outpatient Prescriptions on File Prior to Visit  Medication Sig Dispense Refill  . azithromycin (ZITHROMAX) 500 MG tablet Take 1 g (2 tablets) po with food. (Patient not taking: Reported on 02/06/2016) 2 tablet 0  . ibuprofen (ADVIL,MOTRIN) 200 MG tablet Take 400 mg by mouth every 8 (eight) hours as needed for pain. Reported on 02/06/2016     No current facility-administered medications on file prior to visit.    ROS ROS otherwise unremarkable unless listed above.   Physical Examination: BP 134/80 mmHg  Pulse 68  Temp(Src) 97.9 F (36.6 C)  Resp 16  Ht 6' 0.5" (1.842 m)  Wt 154 lb (69.854 kg)  BMI 20.59 kg/m2  SpO2 98% Ideal Body Weight: Weight in (lb) to have BMI = 25: 186.5  Physical Exam  Constitutional: He is oriented to person, place, and time. He  appears well-developed and well-nourished. No distress.  HENT:  Head: Normocephalic and atraumatic.  Eyes: Conjunctivae and EOM are normal. Pupils are equal, round, and reactive to light.  Cardiovascular: Normal rate.   Pulmonary/Chest: Effort normal. No respiratory distress.  Neurological: He is alert and oriented to person, place, and time.  Skin: Skin is warm and dry. He is not diaphoretic.  Psychiatric: He has a normal mood and affect. His behavior is normal.    Results for orders placed or performed in visit on 02/06/16  HIV antibody  Result Value Ref Range   HIV 1&2 Ab, 4th Generation NONREACTIVE NONREACTIVE  RPR  Result Value Ref Range   RPR Ser Ql NON REAC NON REAC     Assessment and Plan: Frank Oconnell is a 25 y.o. male who is here today for std testing.   Counseled on sexual intercourse protection and std.  Screen for STD (sexually transmitted disease) - Plan: HIV antibody, RPR, GC/Chlamydia Probe Amp  Unprotected sex - Plan: HIV antibody, RPR, GC/Chlamydia Probe Amp  Trena PlattStephanie English, PA-C [facility] 03/10/2016 6:47 PM

## 2016-10-17 DIAGNOSIS — Z7251 High risk heterosexual behavior: Secondary | ICD-10-CM | POA: Diagnosis not present

## 2016-10-17 DIAGNOSIS — Z202 Contact with and (suspected) exposure to infections with a predominantly sexual mode of transmission: Secondary | ICD-10-CM | POA: Diagnosis not present

## 2016-10-24 DIAGNOSIS — Z7251 High risk heterosexual behavior: Secondary | ICD-10-CM | POA: Diagnosis not present

## 2016-10-28 DIAGNOSIS — K080 Exfoliation of teeth due to systemic causes: Secondary | ICD-10-CM | POA: Diagnosis not present

## 2017-02-10 DIAGNOSIS — J3089 Other allergic rhinitis: Secondary | ICD-10-CM | POA: Diagnosis not present

## 2017-04-17 DIAGNOSIS — Z7251 High risk heterosexual behavior: Secondary | ICD-10-CM | POA: Diagnosis not present

## 2017-04-17 DIAGNOSIS — R21 Rash and other nonspecific skin eruption: Secondary | ICD-10-CM | POA: Diagnosis not present

## 2017-11-25 ENCOUNTER — Encounter: Payer: Self-pay | Admitting: Family Medicine

## 2017-11-25 ENCOUNTER — Ambulatory Visit: Payer: Federal, State, Local not specified - PPO | Admitting: Family Medicine

## 2017-11-25 ENCOUNTER — Other Ambulatory Visit: Payer: Self-pay

## 2017-11-25 VITALS — BP 106/84 | HR 82 | Temp 98.6°F | Ht 72.05 in | Wt 148.6 lb

## 2017-11-25 DIAGNOSIS — K219 Gastro-esophageal reflux disease without esophagitis: Secondary | ICD-10-CM

## 2017-11-25 DIAGNOSIS — R1013 Epigastric pain: Secondary | ICD-10-CM

## 2017-11-25 MED ORDER — RANITIDINE HCL 150 MG PO TABS: 150.0000 mg | ORAL_TABLET | Freq: Two times a day (BID) | ORAL | 1 refills | Status: AC

## 2017-11-25 NOTE — Progress Notes (Signed)
2/5/20196:12 PM  Frank SchlatterMichael D Oconnell 03/28/1991, 27 y.o. male [id number]  Chief Complaint  Patient presents with  . Abdominal Pain    nausea, and some vomiting. Says he started drinking protein shakes and has not felt good ever since    HPI:   Patient is a 27 y.o. male who presents today for epigastric pain with nausea and intermittent non-bloody vomiting for about the past week. He states everything started when he started drinking protein shake supplements trying to gain weight. He works at the [employer] and during the holidays was working really long hours and was not eating as well nor was he exercising so he lost weight and muscle mass. He was trying to rebuild these and therefore started using these new protein shake supplements. He denies any fever or chills, diarrhea, decrease appetite. He does get heartburn, feels bloated, gassy. He has since stopped these supplements. He has not tried anything for these symptoms. He had significantly cut back on drinking several months ago, now drinks 1-2 drinks a week. He has not been taking any NSAIDs of recent, he does not smoke. He denies any fhx of significant GI issues.   [facility] 11/25/2017 02/06/2016  Decreased Interest 0 0  Down, Depressed, Hopeless 0 0  PHQ - 2 Score 0 0    No Known Allergies  Prior to Admission medications   Medication Sig Start Date End Date Taking? Authorizing Provider  ibuprofen (ADVIL,MOTRIN) 200 MG tablet Take 400 mg by mouth every 8 (eight) hours as needed for pain. Reported on 02/06/2016   Yes [provider]    Past Medical History:  Diagnosis Date  . Allergy     History reviewed. No pertinent surgical history.  Social History   Tobacco Use  . Smoking status: Light Tobacco Smoker    Types: Cigars  . Smokeless tobacco: Former Engineer, waterUser  Substance Use Topics  . Alcohol use: Yes    Alcohol/week: 0.0 oz    Comment: occ    Family History  Problem Relation Age of Onset  .  Hypertension Mother   . Kidney disease Maternal Grandmother     ROS Per hpi  OBJECTIVE:  Blood pressure 106/84, pulse 82, temperature 98.6 F (37 C), temperature source Oral, height 6' 0.05" (1.83 m), weight 148 lb 9.6 oz (67.4 kg), SpO2 98 %.  Physical Exam  Constitutional: He is oriented to person, place, and time and well-developed, well-nourished, and in no distress.  HENT:  Head: Normocephalic and atraumatic.  Mouth/Throat: Oropharynx is clear and moist.  Eyes: EOM are normal. Pupils are equal, round, and reactive to light.  Neck: Neck supple.  Cardiovascular: Normal rate and regular rhythm. Exam reveals no gallop and no friction rub.  No murmur heard. Pulmonary/Chest: Effort normal and breath sounds normal. He has no wheezes. He has no rales.  Abdominal: Soft. Bowel sounds are normal. There is no hepatosplenomegaly. There is tenderness in the epigastric area. There is no rebound and no guarding.  Neurological: He is alert and oriented to person, place, and time. Gait normal.  Skin: Skin is warm and dry.     ASSESSMENT and PLAN  1. Epigastric pain Discussed supportive measures, new meds r/se/b and RTC precautions. - CBC - Comprehensive metabolic panel - Lipase  2. Gastroesophageal reflux disease without esophagitis - H. pylori antigen, stool; Future  Other orders - ranitidine (ZANTAC) 150 MG tablet; Take 1 tablet (150 mg total) by mouth 2 (two) times daily.  Return  in about 4 weeks (around 12/23/2017).    Myles Lipps, MD [facility] [address] [place], [place] [address] Ph.  [phone] Fax [phone]

## 2017-11-25 NOTE — Patient Instructions (Signed)
     IF you received an x-ray today, you will receive an invoice from [facility]. Please contact [facility] at [phone] with questions or concerns regarding your invoice.   IF you received labwork today, you will receive an invoice from [facility]. Please contact [facility] at [phone] with questions or concerns regarding your invoice.   Our billing staff will not be able to assist you with questions regarding bills from these companies.  You will be contacted with the lab results as soon as they are available. The fastest way to get your results is to activate your My Chart account. Instructions are located on the last page of this paperwork. If you have not heard from us regarding the results in 2 weeks, please contact this office.     

## 2017-11-26 LAB — CBC
Hematocrit: 41.9 % (ref 37.5–51.0)
Hemoglobin: 14.1 g/dL (ref 13.0–17.7)
MCH: 29.6 pg (ref 26.6–33.0)
MCHC: 33.7 g/dL (ref 31.5–35.7)
MCV: 88 fL (ref 79–97)
Platelets: 233 10*3/uL (ref 150–379)
RBC: 4.77 x10E6/uL (ref 4.14–5.80)
RDW: 13.8 % (ref 12.3–15.4)
WBC: 4.8 10*3/uL (ref 3.4–10.8)

## 2017-11-26 LAB — COMPREHENSIVE METABOLIC PANEL
ALT: 13 IU/L (ref 0–44)
AST: 14 IU/L (ref 0–40)
Albumin/Globulin Ratio: 2 (ref 1.2–2.2)
Albumin: 4.7 g/dL (ref 3.5–5.5)
Alkaline Phosphatase: 52 IU/L (ref 39–117)
BUN/Creatinine Ratio: 11 (ref 9–20)
BUN: 12 mg/dL (ref 6–20)
Bilirubin Total: 0.2 mg/dL (ref 0.0–1.2)
CO2: 26 mmol/L (ref 20–29)
Calcium: 9.7 mg/dL (ref 8.7–10.2)
Chloride: 101 mmol/L (ref 96–106)
Creatinine, Ser: 1.08 mg/dL (ref 0.76–1.27)
GFR calc Af Amer: 110 mL/min/{1.73_m2} (ref >59)
GFR calc non Af Amer: 96 mL/min/{1.73_m2} (ref >59)
Globulin, Total: 2.3 g/dL (ref 1.5–4.5)
Glucose: 83 mg/dL (ref 65–99)
Potassium: 4.3 mmol/L (ref 3.5–5.2)
Sodium: 141 mmol/L (ref 134–144)
Total Protein: 7 g/dL (ref 6.0–8.5)

## 2017-11-26 LAB — LIPASE: Lipase: 28 U/L (ref 13–78)

## 2017-11-27 ENCOUNTER — Encounter: Payer: Self-pay | Admitting: Family Medicine

## 2017-12-04 ENCOUNTER — Encounter: Payer: Self-pay | Admitting: Family Medicine

## 2017-12-09 ENCOUNTER — Telehealth: Payer: Self-pay | Admitting: Family Medicine

## 2017-12-09 NOTE — Telephone Encounter (Signed)
Patient called in for lab results, advised that a letter was sent on 11/27/17, he says "I thought someone was going to call me. I don't have a key to the mailbox, so I haven't gotten my mail yet. Patient given results per notes of Dr. Leretha PolSantiago on 11/27/17 per letter that was sent out, patient verbalized understanding. Unable to document in result note due to result [facility].

## 2017-12-23 ENCOUNTER — Ambulatory Visit: Payer: Federal, State, Local not specified - PPO | Admitting: Family Medicine

## 2017-12-31 ENCOUNTER — Ambulatory Visit: Payer: Federal, State, Local not specified - PPO | Admitting: Family Medicine

## 2018-04-24 DIAGNOSIS — Z711 Person with feared health complaint in whom no diagnosis is made: Secondary | ICD-10-CM | POA: Diagnosis not present

## 2018-10-23 DIAGNOSIS — R300 Dysuria: Secondary | ICD-10-CM | POA: Diagnosis not present

## 2018-10-23 DIAGNOSIS — B356 Tinea cruris: Secondary | ICD-10-CM | POA: Diagnosis not present

## 2018-11-05 DIAGNOSIS — N50812 Left testicular pain: Secondary | ICD-10-CM | POA: Diagnosis not present

## 2018-11-18 DIAGNOSIS — R109 Unspecified abdominal pain: Secondary | ICD-10-CM | POA: Diagnosis not present

## 2018-11-18 DIAGNOSIS — H1031 Unspecified acute conjunctivitis, right eye: Secondary | ICD-10-CM | POA: Diagnosis not present

## 2018-11-18 DIAGNOSIS — Z0189 Encounter for other specified special examinations: Secondary | ICD-10-CM | POA: Diagnosis not present

## 2019-01-18 DIAGNOSIS — R109 Unspecified abdominal pain: Secondary | ICD-10-CM | POA: Diagnosis not present

## 2019-01-18 DIAGNOSIS — Z0000 Encounter for general adult medical examination without abnormal findings: Secondary | ICD-10-CM | POA: Diagnosis not present

## 2019-01-20 DIAGNOSIS — Z202 Contact with and (suspected) exposure to infections with a predominantly sexual mode of transmission: Secondary | ICD-10-CM | POA: Diagnosis not present

## 2019-01-20 DIAGNOSIS — Z0000 Encounter for general adult medical examination without abnormal findings: Secondary | ICD-10-CM | POA: Diagnosis not present

## 2019-01-20 DIAGNOSIS — R109 Unspecified abdominal pain: Secondary | ICD-10-CM | POA: Diagnosis not present

## 2019-01-21 DIAGNOSIS — Z202 Contact with and (suspected) exposure to infections with a predominantly sexual mode of transmission: Secondary | ICD-10-CM | POA: Diagnosis not present

## 2019-01-21 DIAGNOSIS — Z0000 Encounter for general adult medical examination without abnormal findings: Secondary | ICD-10-CM | POA: Diagnosis not present
